# Patient Record
Sex: Female | Born: 1995 | Race: White | Hispanic: No | Marital: Single | Smoking: Current some day smoker
Health system: Southern US, Community
[De-identification: ages and names within clinical notes are randomized; demographics above are authoritative.]

---
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
## 2017-10-06 DIAGNOSIS — I10 Essential (primary) hypertension: Secondary | ICD-10-CM | POA: Insufficient documentation

## 2019-01-11 ENCOUNTER — Ambulatory Visit: Payer: Self-pay

## 2019-01-24 ENCOUNTER — Other Ambulatory Visit: Payer: Self-pay

## 2019-01-24 DIAGNOSIS — Z20822 Contact with and (suspected) exposure to covid-19: Secondary | ICD-10-CM

## 2019-01-27 LAB — NOVEL CORONAVIRUS, NAA: SARS-CoV-2, NAA: NOT DETECTED

## 2019-01-28 DIAGNOSIS — F419 Anxiety disorder, unspecified: Secondary | ICD-10-CM

## 2019-01-28 DIAGNOSIS — L732 Hidradenitis suppurativa: Secondary | ICD-10-CM

## 2019-01-28 DIAGNOSIS — I10 Essential (primary) hypertension: Secondary | ICD-10-CM

## 2019-01-31 ENCOUNTER — Ambulatory Visit: Payer: Medicaid Other

## 2019-02-07 ENCOUNTER — Ambulatory Visit: Payer: Medicaid Other

## 2019-05-23 ENCOUNTER — Ambulatory Visit: Payer: Medicaid Other

## 2019-08-08 ENCOUNTER — Telehealth: Payer: Self-pay | Admitting: Licensed Clinical Social Worker

## 2019-08-08 NOTE — Telephone Encounter (Signed)
-----   Message from Si Raider, RN sent at 08/08/2019 12:39 PM EDT ----- Regarding: referral Hi Marchelle Folks, Can you please reach out to this new member? Contact number [phone] Hx of depression currently on Wellbutrin. First pregnancy. To have prenatal care at [facility]. Thanks!

## 2019-08-15 ENCOUNTER — Ambulatory Visit: Payer: Medicaid Other | Admitting: Licensed Clinical Social Worker

## 2019-08-15 ENCOUNTER — Telehealth: Payer: Self-pay | Admitting: Licensed Clinical Social Worker

## 2019-08-15 NOTE — Telephone Encounter (Signed)
Attempted to call patient to check in to see if she has signed a treatment consent.

## 2020-05-14 ENCOUNTER — Ambulatory Visit: Payer: Self-pay

## 2020-05-14 ENCOUNTER — Ambulatory Visit: Payer: Medicaid Other

## 2021-04-01 NOTE — Progress Notes (Signed)
Patient accidentally went to [place] office and will reschedule

## 2021-04-02 ENCOUNTER — Ambulatory Visit: Payer: Medicaid Other | Admitting: Internal Medicine

## 2021-04-02 ENCOUNTER — Ambulatory Visit: Payer: Medicaid Other

## 2021-04-22 NOTE — Progress Notes (Signed)
No show for appointment. Office will call to reschedule.  

## 2021-04-23 ENCOUNTER — Ambulatory Visit: Payer: Medicaid Other | Admitting: Internal Medicine

## 2021-06-10 ENCOUNTER — Ambulatory Visit: Payer: Medicaid Other | Admitting: Internal Medicine

## 2021-06-10 VITALS — Ht 69.0 in

## 2021-06-10 NOTE — Progress Notes (Signed)
Pt did not show for scheduled appointment.  

## 2022-08-17 ENCOUNTER — Encounter: Payer: Self-pay | Admitting: Emergency Medicine

## 2022-08-17 ENCOUNTER — Emergency Department
Admission: EM | Admit: 2022-08-17 | Discharge: 2022-08-17 | Disposition: A | Discharge status: Home / Self Care | Payer: Medicaid Other | Attending: Emergency Medicine | Admitting: Emergency Medicine

## 2022-08-17 ENCOUNTER — Emergency Department: Payer: Medicaid Other

## 2022-08-17 ENCOUNTER — Other Ambulatory Visit: Payer: Self-pay

## 2022-08-17 DIAGNOSIS — S0993XA Unspecified injury of face, initial encounter: Secondary | ICD-10-CM | POA: Diagnosis present

## 2022-08-17 DIAGNOSIS — S0083XA Contusion of other part of head, initial encounter: Secondary | ICD-10-CM | POA: Diagnosis not present

## 2022-08-17 DIAGNOSIS — I10 Essential (primary) hypertension: Secondary | ICD-10-CM | POA: Diagnosis not present

## 2022-08-17 DIAGNOSIS — X58XXXA Exposure to other specified factors, initial encounter: Secondary | ICD-10-CM | POA: Insufficient documentation

## 2022-08-17 LAB — BASIC METABOLIC PANEL
Anion gap: 7 (ref 5–15)
BUN: 10 mg/dL (ref 6–20)
CO2: 21 mmol/L — ABNORMAL LOW (ref 22–32)
Calcium: 9.5 mg/dL (ref 8.9–10.3)
Chloride: 108 mmol/L (ref 98–111)
Creatinine, Ser: 0.91 mg/dL (ref 0.44–1.00)
GFR, Estimated: >60 mL/min (ref >60)
Glucose, Bld: 105 mg/dL — ABNORMAL HIGH (ref 70–99)
Potassium: 3.9 mmol/L (ref 3.5–5.1)
Sodium: 136 mmol/L (ref 135–145)

## 2022-08-17 LAB — CBC
HCT: 44.8 % (ref 36.0–46.0)
Hemoglobin: 14.3 g/dL (ref 12.0–15.0)
MCH: 28 pg (ref 26.0–34.0)
MCHC: 31.9 g/dL (ref 30.0–36.0)
MCV: 87.8 fL (ref 80.0–100.0)
Platelets: 427 10*3/uL — ABNORMAL HIGH (ref 150–400)
RBC: 5.1 MIL/uL (ref 3.87–5.11)
RDW: 13.2 % (ref 11.5–15.5)
WBC: 10 10*3/uL (ref 4.0–10.5)
nRBC: 0 % (ref 0.0–0.2)

## 2022-08-17 MED ORDER — IBUPROFEN 400 MG PO TABS
400.0000 mg | ORAL_TABLET | Freq: Once | ORAL | Status: AC
  Administered 2022-08-17: 400 mg via ORAL
  Filled 2022-08-17: qty 1
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
## 2023-01-01 ENCOUNTER — Ambulatory Visit
Admission: EM | Admit: 2023-01-01 | Discharge: 2023-01-01 | Disposition: A | Discharge status: Home / Self Care | Payer: Medicaid Other | Attending: Family Medicine | Admitting: Family Medicine

## 2023-01-01 DIAGNOSIS — R030 Elevated blood-pressure reading, without diagnosis of hypertension: Secondary | ICD-10-CM | POA: Diagnosis not present

## 2023-01-01 DIAGNOSIS — Z202 Contact with and (suspected) exposure to infections with a predominantly sexual mode of transmission: Secondary | ICD-10-CM | POA: Diagnosis not present

## 2023-01-01 DIAGNOSIS — J069 Acute upper respiratory infection, unspecified: Secondary | ICD-10-CM | POA: Diagnosis not present

## 2023-01-01 DIAGNOSIS — B9789 Other viral agents as the cause of diseases classified elsewhere: Secondary | ICD-10-CM | POA: Diagnosis not present

## 2023-01-01 DIAGNOSIS — R059 Cough, unspecified: Secondary | ICD-10-CM | POA: Diagnosis present

## 2023-01-01 DIAGNOSIS — I10 Essential (primary) hypertension: Secondary | ICD-10-CM | POA: Diagnosis not present

## 2023-01-01 DIAGNOSIS — Z1152 Encounter for screening for COVID-19: Secondary | ICD-10-CM | POA: Insufficient documentation

## 2023-01-01 LAB — GROUP A STREP BY PCR: Group A Strep by PCR: NOT DETECTED

## 2023-01-01 LAB — SARS CORONAVIRUS 2 BY RT PCR: SARS Coronavirus 2 by RT PCR: NEGATIVE

## 2023-01-01 NOTE — ED Provider Notes (Signed)
[facility]    CSN: [id number] Arrival date & time: 01/01/23  1108      History   Chief Complaint Chief Complaint  Patient presents with   Sore Throat    HPI Amanda Bird is a 27 y.o. female.   HPI  History obtained from the patient. Amanda Bird presents for sore throat that started 3 days ago.  She had strep in September and took all her antibiotics.  No fever, cough, rhinorrhea, vomtiing, diarrhea or headache. No treatments tried prior to arrival.   Patient request STD testing.  She denies any symptoms.   History reviewed. No pertinent past medical history.  Patient Active Problem List   Diagnosis Date Noted   Hypertension 10/06/2017   Hidradenitis suppurativa 07/31/2016   Anxiety 07/31/2016   Depression 07/31/2016   Morbid obesity (HCC) 04/21/2014    History reviewed. No pertinent surgical history.  OB History   No obstetric history on file.      Home Medications    Prior to Admission medications   Medication Sig Start Date End Date Taking? Authorizing Provider  WEGOVY 0.25 MG/0.5ML SOAJ SMARTSIG:0.25 Milligram(s) SUB-Q Once a Week   Yes [provider]    Family History History reviewed. No pertinent family history.  Social History Social History   Tobacco Use   Smoking status: Some Days    Types: Cigarettes   Smokeless tobacco: Never  Vaping Use   Vaping status: Never Used  Substance Use Topics   Alcohol use: Not Currently   Drug use: Never     Allergies   Penicillins   Review of Systems Review of Systems: negative unless otherwise stated in HPI.      Physical Exam Triage Vital Signs ED Triage Vitals  Encounter Vitals Group     BP 01/01/23 1225 (!) 142/104     Systolic BP Percentile --      Diastolic BP Percentile --      Pulse Rate 01/01/23 1225 62     Resp 01/01/23 1225 19     Temp 01/01/23 1225 98.4 F (36.9 C)     Temp src --      SpO2 01/01/23 1225 100 %     Weight --      Height --       Head Circumference --      Peak Flow --      Pain Score 01/01/23 1223 8     Pain Loc --      Pain Education --      Exclude from Growth Chart --    No data found.  Updated Vital Signs BP (!) 142/104 (BP Location: Left Arm)   Pulse 62   Temp 98.4 F (36.9 C)   Resp 19   SpO2 100%   Visual Acuity Right Eye Distance:   Left Eye Distance:   Bilateral Distance:    Right Eye Near:   Left Eye Near:    Bilateral Near:     Physical Exam GEN:     alert, non-toxic appearing female in no distress    HENT:  mucus membranes moist, oropharyngeal without lesions, moderate erythema, no tonsillar hypertrophy or exudates, clear nasal discharge EYES:   pupils equal and reactive, no scleral injection or discharge NECK:  normal ROM, no meningismus   RESP:  no increased work of breathing, clear to auscultation bilaterally CVS:   regular rate and rhythm GU: Deferred due to patient's preference for self swabbing Skin:   warm  and dry, no rash on visible skin    UC Treatments / Results  Labs (all labs ordered are listed, but only abnormal results are displayed) Labs Reviewed  GROUP A STREP BY PCR  SARS CORONAVIRUS 2 BY RT PCR  CERVICOVAGINAL ANCILLARY ONLY    EKG   Radiology No results found.  Procedures Procedures (including critical care time)  Medications Ordered in UC Medications - No data to display  Initial Impression / Assessment and Plan / UC Course  I have reviewed the triage vital signs and the nursing notes.  Pertinent labs & imaging results that were available during my care of the patient were reviewed by me and considered in my medical decision making (see chart for details).       Pt is a 27 y.o. female who presents for 3 days of sore throat.  Amanda Bird is afebrile here without recent antipyretics. Satting well on room air. Overall pt is non-toxic appearing, well hydrated, without respiratory distress. Pulmonary exam is unremarkable.  She has some mild  oropharyngeal erythema without tonsillar exudates or lesions.  Strep PCR and COVID testing obtained and were negative. History consistent with viral respiratory illness. Discussed symptomatic treatment.  Explained lack of efficacy of antibiotics in viral disease.  Typical duration of symptoms discussed.   Patient requesting STD testing.  Denies any symptoms.  Gonorrhea chlamydia and trichomonas swab obtained.  She will look out on her MyChart for results.  Her blood pressure is elevated today at 142/104.  She has history of hypertension.  Advised her to follow-up with her primary care doctor regarding her elevated blood pressures today.  Return and ED precautions given and voiced understanding. Discussed MDM, treatment plan and plan for follow-up with patient who agrees with plan.     Final Clinical Impressions(s) / UC Diagnoses   Final diagnoses:  Viral URI with cough  Possible exposure to STD  Elevated blood pressure reading with diagnosis of hypertension     Discharge Instructions       Your STD test results will be available in the next 72 hours. If positive, someone will contact you.  You should see your results in your MyChart account.   Your strep and COVID tests are negative.   If your were prescribed medication, stop by the pharmacy to pick them up.   You can take Tylenol and/or Ibuprofen as needed for fever reduction and pain relief.    For sore throat: try warm salt water gargles, Mucinex sore throat cough drops or cepacol lozenges, throat spray, warm tea or water with lemon/honey, popsicles or ice, or OTC cold relief medicine for throat discomfort. You can also purchase chloraseptic spray at the pharmacy or dollar store.   It is important to stay hydrated: drink plenty of fluids (water, gatorade/powerade/pedialyte, juices, or teas) to keep your throat moisturized and help further relieve irritation/discomfort.    Return or go to the Emergency Department if symptoms  worsen or do not improve in the next few days  Follow-up with your primary care doctor regarding your elevated blood pressures in the next 2 to 3 weeks.  Keep a log of your blood pressures and take these to your visit.         ED Prescriptions   None    PDMP not reviewed this encounter.   Katha Cabal, DO 01/09/23 2307

## 2023-01-01 NOTE — ED Triage Notes (Signed)
Sore throat x 3 days. No fever no other sx. No OTC meds./

## 2023-01-01 NOTE — Discharge Instructions (Addendum)
Your STD test results will be available in the next 72 hours. If positive, someone will contact you.  You should see your results in your MyChart account.   Your strep and COVID tests are negative.   If your were prescribed medication, stop by the pharmacy to pick them up.   You can take Tylenol and/or Ibuprofen as needed for fever reduction and pain relief.    For sore throat: try warm salt water gargles, Mucinex sore throat cough drops or cepacol lozenges, throat spray, warm tea or water with lemon/honey, popsicles or ice, or OTC cold relief medicine for throat discomfort. You can also purchase chloraseptic spray at the pharmacy or dollar store.   It is important to stay hydrated: drink plenty of fluids (water, gatorade/powerade/pedialyte, juices, or teas) to keep your throat moisturized and help further relieve irritation/discomfort.    Return or go to the Emergency Department if symptoms worsen or do not improve in the next few days  Follow-up with your primary care doctor regarding your elevated blood pressures in the next 2 to 3 weeks.  Keep a log of your blood pressures and take these to your visit.

## 2023-01-02 LAB — CERVICOVAGINAL ANCILLARY ONLY
Chlamydia: NEGATIVE
Comment: NEGATIVE
Comment: NEGATIVE
Comment: NORMAL
Neisseria Gonorrhea: NEGATIVE
Trichomonas: NEGATIVE

## 2023-09-23 NOTE — Progress Notes (Signed)
 [facility] Prenatal Office Visit   Assessment/Plan:    28 y.o. G2P1001 presents for prenatal visit at [redacted]w[redacted]d by Estimated Date of Delivery: 05/07/24. Reviewed prenatal record.  Problem List Items Addressed This Visit       Cardiovascular and Mediastinum   Chronic hypertension affecting pregnancy (HHS-HCC)     Other   Medication exposure during first trimester of pregnancy (HHS-HCC)   Tirzepatide exposure, stopped when she missed her period on August 19, 2023.      Supervision of other high risk pregnancy, antepartum (HHS-HCC) - Primary   Relevant Medications   multivitamin, prenatal, folic acid-iron fumarate, 28 mg iron- 800 mcg Tab   Other Relevant Orders   Urine Culture   Type and Screen (Completed)   Hemoglobin/Thalassemia Profile (Completed)   CBC (Completed)   Hemoglobin A1c (Completed)   Syphilis Screen   Chlamydia/Gonorrhoeae NAA   Varicella zoster antibody, IgG   Rubella Antibody, IgG   Hepatitis C Antibody (Completed)   Hepatitis B Surface Antigen (Completed)   HIV Antigen/Antibody Combo (Completed)   Ferritin (Completed)   US  OB Less Than 14 Wks Transabd 1st Tri Single Gest   Ambulatory referral to [facility] P[facility]   Protein/Creatinine Ratio, Urine   AST (Completed)   ALT (Completed)   Bilirubin, Direct (Completed)   Creatinine (Completed)   TSH (Completed)   Obesity affecting pregnancy in first trimester (HHS-HCC)   Assessment & Plan Pregnancy, first trimester with mild nausea and history of chronic hypertension At 7 weeks and 4 days gestation, she experienced mild nausea and spotting. History of chronic hypertension noted in previous pregnancy. BMI and hypertension increase preeclampsia risk. Stopped GLP-1 medication post-conception.  - Order ultrasound for dating and pregnancy health confirmation. - Conduct baseline prenatal labs, obesity labs and baseline preE labs - Plan early one-hour glucose test between 10 and 12 weeks due to  BMI. - Prescribe prenatal vitamins and continue vitamin D supplementation. - Discuss genetic testing options for trisomy 51, 81, 46, and sex chromosome analysis post [redacted] weeks gestation. - Recommend low-dose aspirin at 12 weeks to reduce preeclampsia risk. - Refer to [facility] prenatal oral health program for dental care. - Schedule phone visit with perinatal nurse for care plan review. - Rotate through prenatal care team, option for more frequent primary provider visits if desired. - Schedule follow-up prenatal visit in 4 weeks.   Does this patient have a diagnosis of gestational diabetes? No  Orders Placed This Encounter  Procedures  . Urine Culture    Standing Status:   Future    Number of Occurrences:   1    Expected Date:   09/23/2023    Expiration Date:   09/22/2024  . Chlamydia/Gonorrhoeae NAA    Standing Status:   Future    Number of Occurrences:   1    Expected Date:   09/23/2023    Expiration Date:   10/24/2023  . CBC    Standing Status:   Future    Number of Occurrences:   1    Expected Date:   09/23/2023    Expiration Date:   09/22/2024  . Hemoglobin A1c    Standing Status:   Future    Number of Occurrences:   1    Expected Date:   09/23/2023    Expiration Date:   09/22/2024  . Syphilis Screen    Standing Status:   Future    Number of Occurrences:   1  Expected Date:   09/23/2023    Expiration Date:   09/22/2024  . Varicella zoster antibody, IgG    Standing Status:   Future    Number of Occurrences:   1    Expected Date:   09/23/2023    Expiration Date:   09/22/2024  . Rubella Antibody, IgG    Standing Status:   Future    Number of Occurrences:   1    Expected Date:   09/23/2023    Expiration Date:   09/22/2024  . Hepatitis C Antibody    Standing Status:   Future    Number of Occurrences:   1    Expected Date:   09/23/2023    Expiration Date:   09/22/2024  . Hepatitis B Surface Antigen    Standing Status:   Future    Number of Occurrences:   1    Expected Date:    09/23/2023    Expiration Date:   09/22/2024  . HIV Antigen/Antibody Combo    Standing Status:   Future    Number of Occurrences:   1    Expected Date:   09/23/2023    Expiration Date:   09/22/2024  . Ferritin    Standing Status:   Future    Number of Occurrences:   1    Expected Date:   09/23/2023    Expiration Date:   09/22/2024  . Protein/Creatinine Ratio, Urine    Standing Status:   Future    Expected Date:   09/23/2023    Expiration Date:   09/22/2024  . AST    Standing Status:   Future    Number of Occurrences:   1    Expected Date:   09/23/2023    Expiration Date:   09/22/2024  . ALT    Standing Status:   Future    Number of Occurrences:   1    Expected Date:   09/23/2023    Expiration Date:   09/22/2024  . Bilirubin, Direct    Standing Status:   Future    Number of Occurrences:   1    Expected Date:   09/23/2023    Expiration Date:   09/22/2024  . Creatinine    Standing Status:   Future    Number of Occurrences:   1    Expected Date:   09/23/2023    Expiration Date:   09/22/2024  . TSH    Standing Status:   Future    Number of Occurrences:   1    Expected Date:   09/23/2023    Expiration Date:   09/22/2024    Release to patient:   Immediate [1]  . Ambulatory referral to [facility] Prenatal Oral Health [facility]    Standing Status:   Future    Expected Date:   09/23/2023    Expiration Date:   09/22/2024    Referral Priority:   Routine    Referral Type:   Generic Referral    Referral Reason:   Specialty Services Required    Number of Visits Requested:   1  . Type and Screen    Standing Status:   Future    Number of Occurrences:   1    Expected Date:   09/23/2023    Expiration Date:   09/22/2024  . US  OB Less Than 14 Wks Transabd 1st Tri Single Gest    Standing Status:   Future    Expected Date:  09/23/2023    Expiration Date:   09/22/2024    Who will perform OB exam?:   OB    Is the patient pregnant?:   Yes    Reason for Exam::   dating and viability    Performed at:   [facility]  .  Hemoglobin/Thalassemia Profile    Standing Status:   Future    Number of Occurrences:   1    Expected Date:   09/23/2023    Expiration Date:   09/22/2024   Future Appointments  Date Time Provider Department Center  10/13/2023 10:00 AM Fayette Ozell Fallow, DO [facility]  10/21/2023  9:00 AM Annelle Amadeo Peters, FNP [facility]      Subjective:    Brekyn Huntoon is being seen today for a routine follow up obstetrical visit. She is at [redacted]w[redacted]d gestation.  History of Present Illness Amanda Bird is a 28 year old female who presents for prenatal care at approximately seven weeks of pregnancy.  She is approximately seven weeks and four days pregnant, with her last menstrual period starting on August 01, 2023. She experiences nausea without vomiting and describes her stomach as feeling 'funny.' She has had some spotting but no associated pain. She has not yet had an ultrasound for this pregnancy.  She had a positive home pregnancy test on August 27, 2023, following a negative test on August 13, 2023. She has not had a menstrual period since the positive test. She was taking Zepbound, a GLP-1 receptor agonist, until August 19, 2023, and has not informed her weight loss clinic of her pregnancy yet.  Her family history is significant for diabetes; her sister was diagnosed with type 1 diabetes at age 38 and is on insulin therapy, and her mother was recently diagnosed with type 2 diabetes. She denies a personal history of gestational diabetes with her previous pregnancy.  She has a history of chronic hypertension during her previous pregnancy with her daughter, Johnetta, born on February 16, 2020. She was not on medication for hypertension post-pregnancy and reports that her blood pressure readings vary depending on the method of measurement.  She has a past medical history of anxiety, well-controlled for the past four years, and migraines, which she experienced ten years ago.  She is currently taking vitamin D supplements and has not started prenatal vitamins yet.        Objective:     Prenatal Vitals Weight: (!) 141.7 kg (312 lb 6.4 oz) Temp: 35.9 C (96.6 F) Pulse: 59 BP: 118/84  BP Readings from Last 3 Encounters:  09/23/23 118/84  08/13/23 114/77  06/05/23 132/95   Prenatal Physical     OB Prenatal Exam: Last filed by Annelle Amadeo Peters, FNP on 09/23/2023  9:13 PM     General Physical Exam     HEENT: normal  Heart: normal  Thyroid: normal    Lungs: normal  Extremities: normal  Lymph Nodes: normal    Neurological: normal  Abdomen: normal                        BP Readings from Last 3 Encounters:  09/23/23 118/84  08/13/23 114/77  06/05/23 132/95
